# Patient Record
Sex: Male | Born: 1960 | Race: White | Hispanic: No
Health system: Southern US, Community
[De-identification: ages and names within clinical notes are randomized; demographics above are authoritative.]

---

## 2004-06-16 ENCOUNTER — Encounter: Admission: RE | Admit: 2004-06-16 | Discharge: 2004-06-16 | Payer: Self-pay | Admitting: Family Medicine

## 2004-06-23 ENCOUNTER — Ambulatory Visit: Payer: Self-pay | Admitting: Internal Medicine

## 2004-08-04 ENCOUNTER — Ambulatory Visit: Payer: Self-pay | Admitting: Internal Medicine

## 2010-08-22 ENCOUNTER — Other Ambulatory Visit: Payer: Self-pay | Admitting: Emergency Medicine

## 2010-08-22 DIAGNOSIS — N4400 Torsion of testis, unspecified: Secondary | ICD-10-CM

## 2010-08-23 ENCOUNTER — Ambulatory Visit
Admission: RE | Admit: 2010-08-23 | Discharge: 2010-08-23 | Disposition: A | Discharge status: Home / Self Care | Payer: BC Managed Care – PPO | Source: Ambulatory Visit | Attending: Emergency Medicine | Admitting: Emergency Medicine

## 2010-08-23 DIAGNOSIS — N4400 Torsion of testis, unspecified: Secondary | ICD-10-CM

## 2012-01-24 ENCOUNTER — Ambulatory Visit
Admission: RE | Admit: 2012-01-24 | Discharge: 2012-01-24 | Disposition: A | Discharge status: Home / Self Care | Payer: BC Managed Care – PPO | Source: Ambulatory Visit | Attending: Emergency Medicine | Admitting: Emergency Medicine

## 2012-01-24 ENCOUNTER — Other Ambulatory Visit: Payer: Self-pay | Admitting: Emergency Medicine

## 2012-01-24 DIAGNOSIS — R05 Cough: Secondary | ICD-10-CM

## 2012-01-26 ENCOUNTER — Other Ambulatory Visit (HOSPITAL_COMMUNITY): Payer: Self-pay | Admitting: Emergency Medicine

## 2012-01-26 DIAGNOSIS — D869 Sarcoidosis, unspecified: Secondary | ICD-10-CM

## 2012-02-01 ENCOUNTER — Ambulatory Visit (HOSPITAL_COMMUNITY)
Admission: RE | Admit: 2012-02-01 | Discharge: 2012-02-01 | Disposition: A | Discharge status: Home / Self Care | Payer: BC Managed Care – PPO | Source: Ambulatory Visit | Attending: Emergency Medicine | Admitting: Emergency Medicine

## 2012-02-01 DIAGNOSIS — D869 Sarcoidosis, unspecified: Secondary | ICD-10-CM | POA: Insufficient documentation

## 2012-02-01 MED ORDER — ALBUTEROL SULFATE (5 MG/ML) 0.5% IN NEBU
2.5000 mg | INHALATION_SOLUTION | Freq: Once | RESPIRATORY_TRACT | Status: AC
  Administered 2012-02-01: 2.5 mg via RESPIRATORY_TRACT

## 2012-12-12 ENCOUNTER — Encounter: Payer: Self-pay | Admitting: Emergency Medicine

## 2021-02-07 ENCOUNTER — Other Ambulatory Visit: Payer: Self-pay | Admitting: Family Medicine

## 2021-02-07 DIAGNOSIS — N390 Urinary tract infection, site not specified: Secondary | ICD-10-CM

## 2021-02-07 DIAGNOSIS — R972 Elevated prostate specific antigen [PSA]: Secondary | ICD-10-CM

## 2021-02-07 DIAGNOSIS — R350 Frequency of micturition: Secondary | ICD-10-CM

## 2021-03-22 ENCOUNTER — Other Ambulatory Visit: Payer: Self-pay

## 2021-03-31 ENCOUNTER — Other Ambulatory Visit: Payer: Self-pay

## 2021-05-16 ENCOUNTER — Other Ambulatory Visit: Payer: 59

## 2021-07-20 ENCOUNTER — Ambulatory Visit
Admission: RE | Admit: 2021-07-20 | Discharge: 2021-07-20 | Disposition: A | Discharge status: Home / Self Care | Payer: 59 | Source: Ambulatory Visit | Attending: Family Medicine | Admitting: Family Medicine

## 2021-07-20 ENCOUNTER — Other Ambulatory Visit: Payer: Self-pay

## 2021-07-20 DIAGNOSIS — R350 Frequency of micturition: Secondary | ICD-10-CM

## 2021-07-20 DIAGNOSIS — R972 Elevated prostate specific antigen [PSA]: Secondary | ICD-10-CM

## 2021-07-20 DIAGNOSIS — N390 Urinary tract infection, site not specified: Secondary | ICD-10-CM

## 2021-07-20 MED ORDER — GADOBENATE DIMEGLUMINE 529 MG/ML IV SOLN
16.0000 mL | Freq: Once | INTRAVENOUS | Status: AC | PRN
  Administered 2021-07-20: 16 mL via INTRAVENOUS

## 2022-07-28 ENCOUNTER — Ambulatory Visit
Admission: RE | Admit: 2022-07-28 | Discharge: 2022-07-28 | Disposition: A | Discharge status: Home / Self Care | Payer: 59 | Source: Ambulatory Visit | Attending: Family Medicine | Admitting: Family Medicine

## 2022-07-28 ENCOUNTER — Other Ambulatory Visit: Payer: Self-pay | Admitting: Family Medicine

## 2022-07-28 DIAGNOSIS — R079 Chest pain, unspecified: Secondary | ICD-10-CM

## 2022-07-28 NOTE — Progress Notes (Signed)
Several month h/o progressive chest discomfort. Non-pleuritic. Associated w/ intermittent fatigue.

## 2022-11-09 ENCOUNTER — Ambulatory Visit
Admission: RE | Admit: 2022-11-09 | Discharge: 2022-11-09 | Disposition: A | Discharge status: Home / Self Care | Payer: 59 | Source: Ambulatory Visit | Attending: Internal Medicine | Admitting: Internal Medicine

## 2022-11-09 ENCOUNTER — Other Ambulatory Visit: Payer: Self-pay | Admitting: Internal Medicine

## 2022-11-09 DIAGNOSIS — I959 Hypotension, unspecified: Secondary | ICD-10-CM

## 2023-09-21 ENCOUNTER — Other Ambulatory Visit: Payer: Self-pay | Admitting: Family Medicine

## 2023-09-21 DIAGNOSIS — N210 Calculus in bladder: Secondary | ICD-10-CM

## 2023-09-21 DIAGNOSIS — N400 Enlarged prostate without lower urinary tract symptoms: Secondary | ICD-10-CM

## 2023-09-21 DIAGNOSIS — N320 Bladder-neck obstruction: Secondary | ICD-10-CM

## 2023-09-21 NOTE — Progress Notes (Signed)
 Progressive sx of bladder outlet obstruction and hematuria.  Repeat MRI ordered.

## 2023-10-03 ENCOUNTER — Inpatient Hospital Stay: Admission: RE | Admit: 2023-10-03 | Source: Ambulatory Visit

## 2023-10-26 ENCOUNTER — Ambulatory Visit
Admission: RE | Admit: 2023-10-26 | Discharge: 2023-10-26 | Disposition: A | Discharge status: Home / Self Care | Source: Ambulatory Visit | Attending: Family Medicine | Admitting: Family Medicine

## 2023-10-26 DIAGNOSIS — N400 Enlarged prostate without lower urinary tract symptoms: Secondary | ICD-10-CM

## 2023-10-26 DIAGNOSIS — N210 Calculus in bladder: Secondary | ICD-10-CM

## 2023-10-26 DIAGNOSIS — N320 Bladder-neck obstruction: Secondary | ICD-10-CM

## 2023-10-26 MED ORDER — GADOPICLENOL 0.5 MMOL/ML IV SOLN
10.0000 mL | Freq: Once | INTRAVENOUS | Status: AC | PRN
  Administered 2023-10-26: 8 mL via INTRAVENOUS

## 2023-12-23 IMAGING — MR MR PROSTATE WO/W CM
12 series · 48 of 48 positions shown · IV contrast (multihance)
Comparison: None.

CLINICAL DATA: Elevated PSA.

EXAM:
MR PROSTATE WITHOUT AND WITH CONTRAST
TECHNIQUE: Multiplanar multisequence MRI images were obtained of the pelvis
centered about the prostate. Pre and post contrast images were
obtained.
CONTRAST:  16mL MULTIHANCE GADOBENATE DIMEGLUMINE 529 MG/ML IV SOLN

[Series 3: T2 · coronal · 3.0mm · 0.56mm/px · 1 of 26 slices shown (1 of 3)]
[im 1/26]
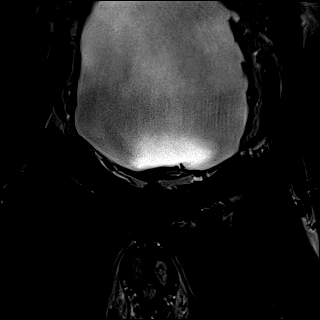

[Series 4: T1 · axial · 5.0mm · 1.25mm/px · 1 of 80 slices shown]
[im 1/80]
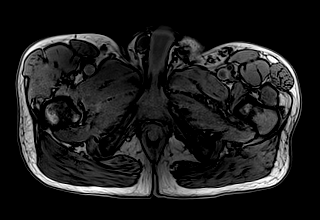

[Series 5: DWI · axial · 3.0mm · 1.75mm/px · z∈[-63,+3]mm · 2 of 69 slices shown (1 of 3)]
[im 1/69]
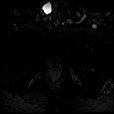
[im 69/69]
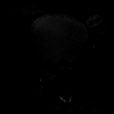

[Series 6: DWI · axial · 3.0mm · 1.75mm/px · 1 of 23 slices shown (2 of 3)]
[im 1/23]
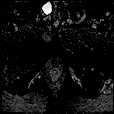

[Series 7: DWI · axial · 3.0mm · 1.75mm/px · 1 of 23 slices shown (3 of 3)]
[im 1/23]
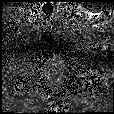

[Series 8: T2 · axial · 3.0mm · 0.56mm/px · 1 of 23 slices shown (2 of 3)]
[im 1/23]
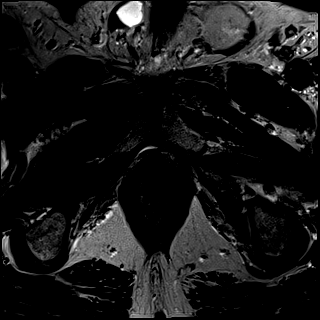

[Series 9: T2 · axial · 1.0mm · 1.04mm/px · z∈[-66,+5]mm · 2 of 72 slices shown (3 of 3)]
[im 1/72]
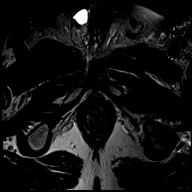
[im 72/72]
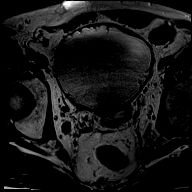

[Series 10: pre t1_twist_tra_dyn · axial · non-contrast · 3.5mm · 0.83mm/px · 1 of 20 slices shown]
[im 1/20]
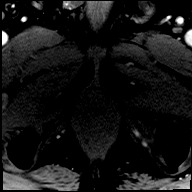

[Series 11: post t1_twist_tra_dyn-copy center · axial · non-contrast · 3.5mm · 0.83mm/px · z∈[-64,+3]mm · 17 of 600 slices shown]
[im 1/600]
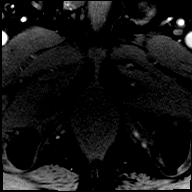
[im 38/600]
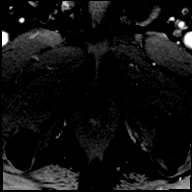
[im 75/600]
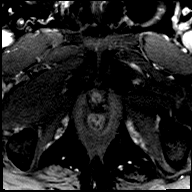
[im 113/600]
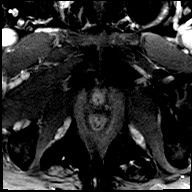
[im 150/600]
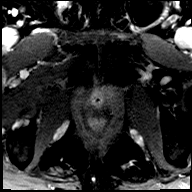
[im 188/600]
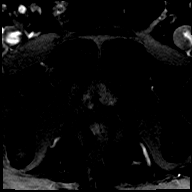
[im 225/600]
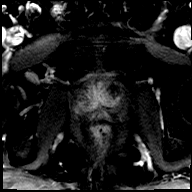
[im 263/600]
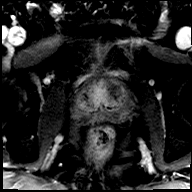
[im 300/600]
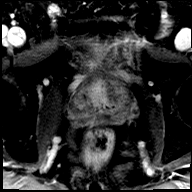
[im 337/600]
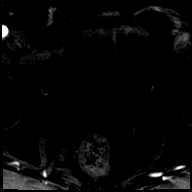
[im 375/600]
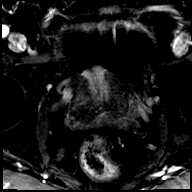
[im 412/600]
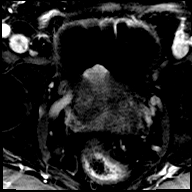
[im 450/600]
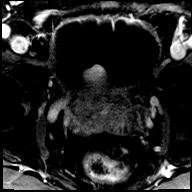
[im 487/600]
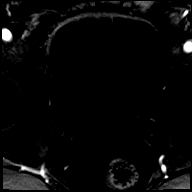
[im 525/600]
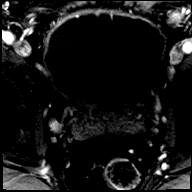
[im 562/600]
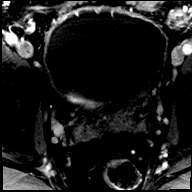
[im 600/600]
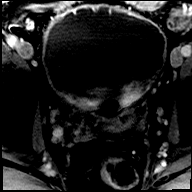

[Series 12: post t1_twist_tra_dyn-copy cent_sub · axial · 3.5mm · 0.83mm/px · z∈[-64,+3]mm · 17 of 580 slices shown]
[im 1/580]
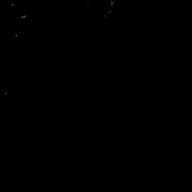
[im 37/580]
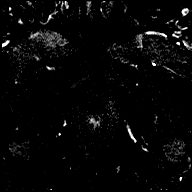
[im 73/580]
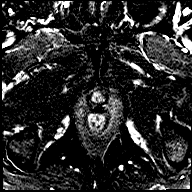
[im 109/580]
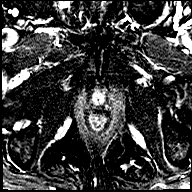
[im 145/580]
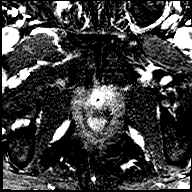
[im 181/580]
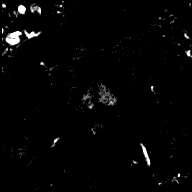
[im 218/580]
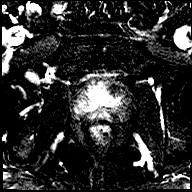
[im 254/580]
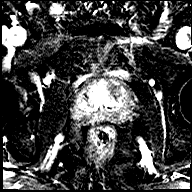
[im 290/580]
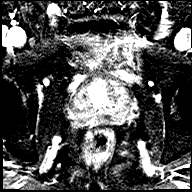
[im 326/580]
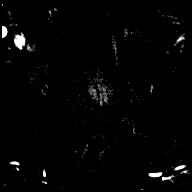
[im 362/580]
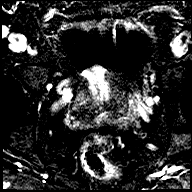
[im 399/580]
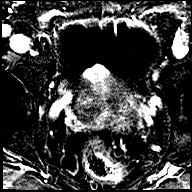
[im 435/580]
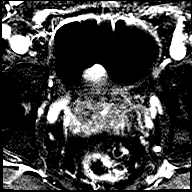
[im 471/580]
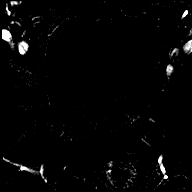
[im 507/580]
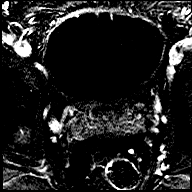
[im 543/580]
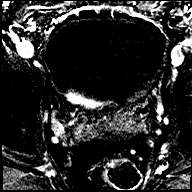
[im 580/580]
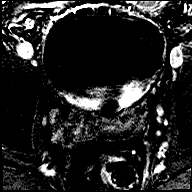

[Series 13: t1_vibe_dixon_tra_f · axial · 2.5mm · 0.91mm/px · z∈[-75,+122]mm · 2 of 80 slices shown]
[im 1/80]
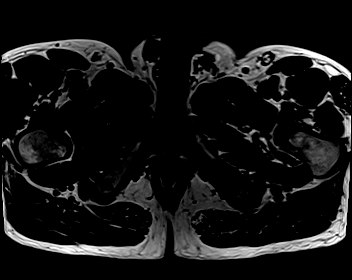
[im 80/80]
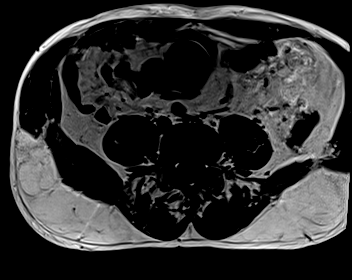

[Series 14: t1_vibe_dixon_tra_w · axial · 2.5mm · 0.91mm/px · z∈[-75,+122]mm · 2 of 80 slices shown]
[im 1/80]
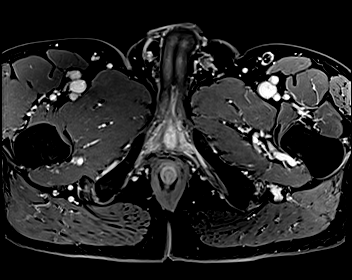
[im 80/80]
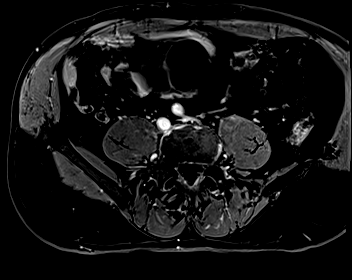

[48 of 48 positions shown; findings below may reference images not displayed]

FINDINGS: Prostate:

-- Peripheral Zone: Diffuse heterogeneous T2 hypointensity is seen.
Linear/wedge shaped hypointensities are noted on ADC; however, no
focal ADC hypointense or high b-value DWI hyperintense nodules are
identified.

-- Transition/Central Zone: Mildly enlarged with encapsulated BPH
nodules and median lobe hypertrophy, which indents the bladder base.
No suspicious nodules identified on T2-weighted or diffusion
sequences.

-- Measurements/Volume:  4.5 by 4.5 x 5.8 cm (volume = 61 cm^3)

Transcapsular spread:  Absent

Seminal vesicle involvement:  Absent

Neurovascular bundle involvement:  Absent

Pelvic adenopathy: None visualized

Bone metastasis: None visualized

Other: Distended urinary bladder is seen with diffuse wall
thickening and trabeculation, as well as several small bladder
diverticula. A 1.9 cm calculus is also seen in the urinary bladder.
These findings are consistent with chronic bladder outlet
obstruction.

Colonic diverticulosis is noted in the left lower quadrant, however
there is no evidence of diverticulitis. A small left inguinal hernia
is also seen, which contains only fat.
IMPRESSION: No radiographic evidence of high-grade prostate carcinoma. PI-RADS 2
(v.2.1): Low (clinically significant cancer unlikely)

Findings of chronic bladder outlet obstruction, with 1.9 cm bladder
calculus.

## 2024-02-22 NOTE — H&P (Addendum)
 [facility] Medicine  History and Physical    Assessment and Plan <redacted file path>   Mitchell Haley is a 63 y.o. male with history of BPH, urinary retention, HTN, and asthma who is presenting to [facility] with Sepsis secondary to UTI    .  Sepsis secondary to suspected UTI  AKI  BPH  Urinary Retention: Patient presented to urgent care with 2 day history of dysuria and gross hematuria that started after urology office visit on 8/12. He was noted to be febrile, tachycardic, and hypotensive to 80/60s in UC, so was sent to ED. Here, vitals notable for fever, tachycardia, and soft BP. Labs notable for mild leukocytosis WBC 12.2, mildly elevated venous lactate 1.9, and Cr 1.73 (baseline ~1.07). UA with large LE, large blood, >182 WBC, and >182 RBC. Patient reports previous history of multiple UTIs, though culture results not available. Last UTI >12 months ago. He has never had gross hematuria. Of note, had traumatic foley placement attempt in urology office two days ago. He is s/p Vancomycin, Cefepime, and LR 30 ml/kg in ED. Will continue broad spectrum antibiotics and follow-up cultures. Suspect can narrow to Ceftriaxone soon, however, given sepsis on presentation, known obstruction and urinary retention, and recent prostate exam and attempted foley placement, will continue broad abx overnight. Renal US  ordered to evaluate for hydronephrosis given known hx of urinary retention and AKI on presentation.  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  - Continue home Valtrex 1g daily ppx   Prophylaxis - Mechanical DVT ppx overnight iso gross hematuria   Diet -No diet orders on file  Code Status / HCDM <redacted file path> -Full Code, Discussed with patient at the time of admission  -  HCDM (patient stated preference): Mitchell Haley,Mitchell Haley - Daughter - [phone]  Anticipated Medically Ready for Discharge: <redacted file path> Anticipated in 2-4 Days  Significant Comorbid Conditions <redacted file path>:  Issues Impacting Complexity of Management: <redacted file path> -Intensive monitoring of drug toxicity from Vancomycin with scheduled BMP and/or Vancomycin levels and Cefepime with scheduled BMP  HPI    Mitchell Haley is a 63 y.o. male who is presenting to [facility] with Sepsis secondary to UTI    .  Patient was recently seen in outpatient urology clinic on 8/12 to establish care and be evaluated for LUTS and BPH. He reported having gross hematuria, nocturia, urinary urgency, urge incontinence, and urinary frequency which he attributed to bladder stones. He had a MRI prostate on 10/2023 which showed BPH, prostatomegaly, and three bladder calculi. His PVR in clinic was 814 mL. He was recommended to get indwelling foley catheter vs do intermittent catheterization for his retention. He chose to do intermittent catheterization, but was unable to perform it in clinic.   Since that visit, he has had significant increase in urinary frequency, urgency, and gross hematuria. He developed fevers and chills at home today which prompted him  to go to urgent care. No chest pain, shortness of breath, abdominal pain, flank pain. Has hx of multiple UTIs in the past with last one being >12 months ago. Has never had gross hematuria before. Of note, in-office catheterization attempt was traumatic.   ED Course: Patient presented with c/f  UTI. Vital Signs: T 38.3, HR 127, RR 20, BP 103/64, SpO2 97% on RA. Labs: WBC 12.2, Cr 1.73, lactate 1.9 Cultures: Blood x2, Urine  EKG: Sinus tachycardia. No ST elevations or depression Interventions: Vancomycin, Cefepime, LR 30 ml/kg, Acetaminophen   Med Rec Confidence <redacted file path>   I reviewed the Medication List. The current list is Accurate  Physical Exam   Temp:  [36.6 C (97.9 F)-38.7 C (101.6 F)] 36.6 C (97.9 F) Pulse:  [84-132] 84 SpO2 Pulse:  [85-87] 85 Resp:  [14-20] 15 BP: (81-138)/(48-84) 118/84 SpO2:  [94 %-98 %] 97 % Body mass index is 24.75 kg/m.  BP 118/84   Pulse 84   Temp 36.6 C (97.9 F) (Oral)   Resp 15   Ht 182.9 cm (6')   Wt 82.8 kg (182 lb 8 oz)   SpO2 97%   BMI 24.75 kg/m  General appearance: alert Head: Normocephalic, without obvious abnormality Eyes: conjunctivae/corneas clear. Neck: no adenopathy and supple, symmetrical, trachea midline Back: negative Lungs: clear to auscultation bilaterally Heart: regular rate and rhythm, S1, S2 normal, no murmur, click, rub or gallop Abdomen: soft, non-tender; bowel sounds normal; no masses,  no organomegaly Extremities: extremities normal, atraumatic, no cyanosis or edema Pulses: 2+ and symmetric. Skin: Skin color, texture, turgor normal. No rashes or lesions

## 2024-02-25 NOTE — Discharge Summary (Signed)
 Physician Discharge Summary [facility] [address] [place] [address] Dept: [phone] Loc: [phone]   Identifying Information:  Mitchell Haley 10-Dec-1960 [id number]  Primary Care Physician: Remonia Alm Pac, MD  Code Status: Full Code  Admit Date: 02/22/2024  Discharge Date: 02/25/2024   Discharge To: Home  Discharge Service: [facility] - Hospitalist [place]   Discharge Attending Physician: Guido Bland Dennise Idella, MD  Discharge Diagnoses: Principal Problem:   Sepsis secondary to UTI     (POA: Yes) Active Problems:   Urinary retention (POA: Yes)   Asthma (HHS-HCC) (POA: Yes)   Benign prostatic hyperplasia (POA: Yes)   Essential hypertension (POA: Yes)   Hypertriglyceridemia (POA: Yes)   Shingles (POA: Yes) Resolved Problems:   * No resolved hospital problems. *   Outpatient Provider Follow Up Issues:  [ ]  TOV set up with urology [ ]  see if flomax with finasteride works for his symptomatic prostate  [ ]  possibly recheck urine if still having any problems, or symptoms  Hospital Course:  Mitchell Haley is a 63 year old male with a history of benign prostatic hyperplasia (BPH), urinary retention, hypertension, asthma, hypertriglyceridemia, and prior shingles, who was admitted on 02/22/2024 for sepsis secondary to urinary tract infection (UTI) following recent urologic instrumentation.  PROBLEM-ORIENTED HOSPITAL SUMMARY  Sepsis Secondary to UTI / Acute Kidney Injury (AKI) He presented with two days of dysuria, gross hematuria, fever, and chills after a traumatic in-office catheterization attempt for urinary retention and BPH. On arrival, he was febrile, tachycardic, and hypotensive. Initial labs showed leukocytosis (WBC 12.2), elevated creatinine (1.73, baseline ~1.07), and urinalysis with large leukocyte esterase, large blood, and >182 WBC and RBC. He received vancomycin, cefepime, and IV fluids in the ED. Blood and urine cultures  were obtained and remained negative at 48 hours. Renal ultrasound showed no hydronephrosis or stones, with a collapsed bladder due to the indwelling Foley. His creatinine improved to 0.90 by 8/18. 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 Reviewing literature saw that some abx that treat this bug do not penetrate the prostate well. Due to ths discussed about this prostate exam done just the week prior to ask about discomfort or pain he has associated with his prostate. He denied. Thus, chose to finish treatment with doxycycline for 14 days and not treat as prostatitis but more as a complex UTI.  He clinically improved with resolution of fever and normalization of labs prior to discharge.  Urinary Retention / Benign Prostatic Hyperplasia He has a history of BPH with significant post-void residual (814 mL) and multiple prior UTIs. He was unable to perform intermittent catheterization at home and had a traumatic catheterization attempt in clinic. During hospitalization, a Foley catheter was maintained for urinary drainage, with improvement in urine clarity and output. Urology was consulted for follow-up, and a trial of void and possible clean intermittent catheterization teaching was planned for 7-12 days post-discharge. He was instructed on leg bag use prior to discharge. I also  asked as he has been on flomax with not full  resolution of symptoms, thus started finasterfide on discharge with hopes he can get the foley out and dec sx.   Essential Hypertension Home amlodipine was held during the acute phase due to sepsis and hypotension. Blood pressure stabilized during hospitalization, but amlodipine remained on hold as of 8/17 due  to systolic BP of 125. Restarted last day.   Asthma Home montelukast was continued throughout the  hospitalization without reported exacerbation.  Hypertriglyceridemia Home fenofibrate was continued during the admission.  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  Other He was independent with activities of daily living prior to admission and declined home health services at discharge. He lives with his adult daughter, who will assist with care at home.   Procedures: None No admission procedures for hospital encounter. ______________________________________________________________________ Discharge Medications:   Your Medication List     START taking these medications    acetaminophen 500 MG tablet Commonly known as: TYLENOL Take 2 tablets (1,000 mg total) by mouth every eight (8) hours as needed.   doxycycline 100 MG capsule Commonly known as: VIBRAMYCIN Take 1 capsule (100 mg total) by mouth two (2) times a day.   finasteride 5 mg tablet Commonly known as: PROSCAR Take 1 tablet (5 mg total) by mouth daily.       CONTINUE taking these medications    amlodipine 5 MG tablet Commonly known as: NORVASC Take 1 tablet (5 mg total) by mouth nightly.   fenofibrate 160 MG tablet Commonly known as: LOFIBRA Take 1 tablet (160 mg total) by mouth in the morning.   FLOMAX 0.4 mg capsule Generic drug: tamsulosin Take 1 capsule (0.4 mg total) by mouth in the morning.   montelukast 10 mg tablet Commonly known as: SINGULAIR Take 1 tablet (10 mg total) by mouth in the morning.   multivitamin per tablet Commonly known as: TAB-A-VITE/THERAGRAN Take 1 tablet by mouth in the morning.   valACYclovir 500 MG tablet Commonly known as: VALTREX Take 2 tablets (1,000 mg total) by mouth daily.   VITAMIN D3 ORAL Take 1 tablet by mouth daily.        Allergies: Aspirin, Penicillins, Aspirin, and Penicillin  g ______________________________________________________________________ Pending Test Results (if blank, then none): Pending Labs     Order Current Status   Blood Culture Preliminary result   Blood Culture Preliminary result       Most Recent Labs: Microbiology -  Microbiology Results (last day)     Procedure Component Value Date/Time Date/Time   Blood Culture [[id number]]  (Normal) Collected: 02/22/24 2151   Lab Status: Preliminary result Specimen: Blood from 1 Peripheral Draw Updated: 02/24/24 2215    Blood Culture, Routine No Growth at 48 hours   Blood Culture [[id number]]  (Normal) Collected: 02/22/24 2151   Lab Status: Preliminary result Specimen: Blood from 1 Peripheral Draw Updated: 02/24/24 2215    Blood Culture, Routine No Growth at 48 hours      CBC - Results in Past 30 Days Result Component Current Result Ref Range Previous Result Ref Range  HCT 39.5 (02/25/2024) 39.0 - 48.0 % 40.4 (02/23/2024) 39.0 - 48.0 %  HGB 13.6 (02/25/2024) 12.9 - 16.5 g/dL 86.3 (1/83/7974) 87.0 - 16.5 g/dL  MCH 69.9 (1/81/7974) 74.0 - 32.4 pg 30.2 (02/23/2024) 25.9 - 32.4 pg  MCHC 34.5 (02/25/2024) 32.0 - 36.0 g/dL 66.1 (1/83/7974) 67.9 - 36.0 g/dL  MCV 13.0 (1/81/7974) 22.3 - 95.7 fL 89.5 (02/23/2024) 77.6 - 95.7 fL  MPV 8.9 (02/25/2024) 6.8 - 10.7 fL 8.5 (02/22/2024) 6.8 - 10.7 fL  Platelet 180 (02/25/2024) 150 - 450 10*9/L >182 (02/23/2024) 150 - 450 10*9/L  RBC 4.55 (02/25/2024) 4.26 - 5.60 10*12/L 4.51 (02/23/2024) 4.26 - 5.60 10*12/L  WBC 7.3 (  02/25/2024) 3.6 - 11.2 10*9/L 10.2 (02/23/2024) 3.6 - 11.2 10*9/L   BMP - Results in Past 30 Days Result Component Current Result Ref Range Previous Result Ref Range  BUN 7 (L) (02/25/2024) 9 - 23 mg/dL 11 (1/83/7974) 9 - 23 mg/dL  Chloride 897 (1/81/7974) 98 - 107 mmol/L 104 (02/23/2024) 98 - 107 mmol/L  CO2 25.0 (02/25/2024) 20.0 - 31.0 mmol/L 26.0 (02/23/2024) 20.0 - 31.0 mmol/L  Creatinine 0.90 (02/25/2024) 0.73 - 1.18 mg/dL 8.74 (H) (1/83/7974) 9.26 - 1.18  mg/dL  Glucose 879 (1/81/7974) 70 - 179 mg/dL 879 (1/83/7974) 70 - 820 mg/dL  Potassium 3.4 (1/81/7974) 3.4 - 4.8 mmol/L 4.0 (02/23/2024) 3.4 - 4.8 mmol/L  Sodium 139 (02/25/2024) 135 - 145 mmol/L 142 (02/23/2024) 135 - 145 mmol/L    Relevant Studies/Radiology (if blank, then none): ECG 12 Lead Result Date: 02/23/2024 SINUS TACHYCARDIA NON-DIAGNOSTIC INFERIOR Q WAVES POOR R WAVE PROGRESSION IN PRECORDIAL LEADS WHEN COMPARED WITH ECG OF 09-Aug-2022 13:03, CRITERIA FOR ANTERIOR INFARCT  ARE NO LONGER PRESENT CHEST LEAD PLACEMENT CHANGE IS SUSPECTED Confirmed by Vinie Dunnings ([id number]) on 02/23/2024 9:12:23 AM  US  Renal Complete Result Date: 02/23/2024 EXAM: US  RENAL COMPLETE ACCESSION: [id number] UN REPORT DATE: 02/22/2024 11:18 PM CLINICAL INDICATION: 63 years old with dysuria, hematuria eval hydronephrosis  COMPARISON: None. TECHNIQUE: Static and cine images of the kidneys and bladder were performed. FINDINGS: KIDNEYS: Normal size and echogenicity. No solid masses or calculi. No hydronephrosis.      Right kidney: 11.0 cm      Left kidney: 11.5 cm BLADDER: The urinary bladder is collapsed with a Foley catheter in place, limiting evaluation.   No evidence of hydronephrosis.   ______________________________________________________________________ Discharge Instructions:  Activity Instructions     Activity as tolerated         Diet Instructions     Discharge diet (specify)     Discharge Nutrition Therapy: Regular       Other Instructions     Call MD for:     Blood in foley, pain with no output from foley to bag. Any concerns for infection or other concerning symptoms.   Call MD for:  difficulty breathing, headache or visual disturbances     Call MD for:  persistent nausea or vomiting     Call MD for:  severe uncontrolled pain     Call MD for: Temperature > 38.5 Celsius ( > 101.3 Fahrenheit)     Discharge instructions     You came in with urinary obstruction with bad UTI and had sepsis  but was able to be rapidly treated with IVF, IV abx and close monitoring.   Your urine grew out an uncommon bacteria but more often associated with prostate problems.    >100,000 CFU/mL Actinotignum (Actinobaculum) schaalii  You will be treated with doxycycline to complete your treatment of this infection. If you have fevers or other concerns pls reach out for medical assistance.   You will have follow up in Urology clinic for trial of void to get the foley out. Continue your flomax and we will start finasteride to see if this helps your urinary symptoms. But it can dec libido and low some testosterone levels, just so you know.  Pls continue to foley care taught at the hospital. Thank you.       Follow Up instructions and Outpatient Referrals    Call MD for:     Call MD for:  difficulty breathing, headache or visual disturbances     Call MD for:  persistent nausea or vomiting     Call MD for:  severe uncontrolled pain     Call MD for: Temperature > 38.5 Celsius ( > 101.3 Fahrenheit)     Discharge instructions       Appointments which have been scheduled for you    Mar 04, 2024 2:00 PM (Arrive by 1:30 PM) NURSE VISIT URO with SURURO NURSE [facility] UROLOGY [address] [employer] [place] [facility] REGION) [address] [place] [address] [phone]     Mar 12, 2024 10:15 AM (Arrive by 9:45 AM) CYSTOSCOPY with [facility] PROCEDURES [facility] UROLOGY PROCEDURES [place] [facility] REGION) [address] [place] [address] [phone]     Apr 01, 2024 9:15 AM (Arrive by 9:00 AM) RETURN UROLOGY with Nicholaus [place] V[place]as[place], MD [facility] UROLOGY SERVICE [place] [facility] REGION) [address] [place] [address] [place] [address] [phone]        ______________________________________________________________________ Discharge Day Services: BP 106/81   Pulse 95   Temp 36.8 C (98.2 F) (Oral)   Resp 18   Ht  182.9 cm (6')   Wt 80.9 kg (178 lb 4.8 oz)   SpO2 97%   BMI 24.18 kg/m  Pt seen on the day of discharge and determined appropriate for discharge.  Condition at Discharge: good  Length of Discharge: I spent greater than 30 mins in the discharge of this patient.

## 2024-04-01 NOTE — Progress Notes (Signed)
 ANALYSIS AND PLAN:  This is a 63 y.o. male with a history of asthma and high blood pressure who is referred by his primary care physician for the assessment of bothersome lower urinary tract symptoms suggestive of symptomatic BPH. I reviewed the patients kidney stone history and postvoid residual in clinic today. His PVR is 806 ml and IPSS 10(1). I reviewed the patients recent local cystoscopy findings and prior prostate size on MRI imaging with multiple bladder stones. We reviewed his response to Flomax and Finasteride medications in which he may continue. The risk and side effects of the medications were discussed including the FDA blackbox warning for finasteride as a prostate cancer prevention drug and the expected artificial halving of the PSA value on therapy. The patient will obtain outside PSA results for my review. I remain concerned with the patient's significant postvoid residual though I think it is favorable that the patient's ultrasound does not currently show any upper tract dilation. He has not been able to perform intermittent catheterization and is not interested in long term catheterization. Discussed newer gold standard treatment option holmium laser enucleation of the prostate with Cystolitholapaxy for bladder stone removal. Specifically for HoLEP surgery, the risk and benefits of the procedure including but not limited to bleeding with possible need for blood transfusion, infection, injury to the ureter, bladder, urethra, injury to the urinary sphincter resulting in incontinence, need for multiple surgeries, erectile or ejaculatory problems specifically likelihood of retrograde ejaculation, need for prolonged foley placement, incidental diagnosis of prostate cancer and the risks of general anesthesia. We discussed the irritative urinary symptoms and risk of morcellator injury to the bladder which are specific to the laser enucleation surgery. All questions were answered.  The patient will  contact our clinic for further treatment. We would be happy to see the patient back anytime sooner if symptoms arise.   Remonia Alm Pac, MD [address] [place],  [place] [address]  CHIEF COMPLAINT: History of BPH and bothersome lower urinary tract symptoms here for follow-up.   BRIEF HISTORY OF PRESENT ILLNESS:  Mitchell Haley is very pleasant 63 y.o. male who returns for the evaluation of bothersome lower urinary tract symptoms and BPH. The patient notes urinary complaints for a prolonged amount of time. He is currently maintained on medication therapy including tamsulosin. He notes being on Flomax for the last 3 years. He reports resolution of urinary symptoms while on medication. He notes a history of bladder stones and urinary tract/bladder infections. Patient reports an elevated PSA of 4.5 about 3 years ago. He notes this was during a time of a urinary infection. A prostate MRI was done which identified multiple bladder stones. His primary urinary complaints include gross hematuria, nocturia 1-2x, urinary urgency, urge incontinence and urinary frequency which he attributes to the bladder stones. At his last clinic visit, he was recommended to perform intermittent catheterization but was unable to perform the technique. He subsequently required indwelling catheter placement for a period of time. Since he was last seen, he reports he is voiding freely with no issues. Patient was started on Finasteride following recent hospital visit, he reports no issues. He denies a history of kidney stones. He notes his PSA is monitored yearly which have been low and stable. The patient denies a history of dysuria. He has never had a prostate biopsy in the past. He notes his father experienced prostate issues and his paternal uncle diagnosed with prostate cancer following years of prostate issues. He does  not have a history of sleep apnea or significant snoring. The patient has no history of fevers,  chills, nausea, vomiting, chest pain or shortness of breath.   OSH GU history:   PAST MEDICAL HISTORY:    Past Medical History[1]   PAST SURGICAL HISTORY:  Past Surgical History[2]  ALLERGIES:   is allergic to aspirin, penicillins, aspirin, and penicillin g.  MEDICATIONS: Current Medications[3]   SOCIAL HISTORY: Short Social History[4]   FAMILY HISTORY:  Positive for prostate cancer disease. Family History[5]   REVIEW OF SYSTEMS:  A 10-system review of systems was completed by the patient and reviewed by me today.  All pertinent positives were discussed in history of present illness.  PHYSICAL EXAMINATION: GENERAL:  In no apparent distress. HEENT:  Eyes, ears and mouth appeared normal. LYMPHATICS:  No cervical lymphadenopathy. LUNGS:  Chest wall excursion symmetric bilaterally. No audible wheezing ABDOMEN:  Soft, nontender, nondistended. BACK:  Negative CVA tenderness.    DRE: Prostate moderately enlarged, smooth, soft no nodules EXTREMITIES:  No lower extremity edema. SKIN:  No rashes or jaundice. NEUROLOGIC:  No focal deficits on neurologic exam.  LABORATORY TESTING:      Chemistry      Component Value Date/Time   NA 139 02/25/2024 0901   K 3.4 02/25/2024 0901   CL 102 02/25/2024 0901   CO2 25.0 02/25/2024 0901   BUN 7 (L) 02/25/2024 0901   CREATININE 0.90 02/25/2024 0901   GLU 120 02/25/2024 0901      Component Value Date/Time   CALCIUM 8.9 02/25/2024 0901   ALKPHOS 49 02/22/2024 2033   AST 20 02/22/2024 2033   ALT 25 02/22/2024 2033   BILITOT 1.2 02/22/2024 2033      10/29/2023 OSH MRI prostate report    02/22/2024 RUS Impression  No evidence of hydronephrosis.   03/12/2024 Local cystoscopy Flexible cystoscopy was performed.  The urethral meatus and urethra were normal.  The prostate was trilobar with severe coaptation. There was a impressive median intravesical protrusion. The bladder had no tumors but did have multiple spciulated bladder  stoens. There was severe trabeculatio with small scattered diverticuli.  02/19/2024 AUA symptom score total of 24 with bother of 5 and leakage of 1 02/19/2024  postvoid residual >864 ml , 814 ml on second void   04/01/2024 AUA SS total of 10 with bother of 1 04/03/2024 postvoid residual 806 ml   Scribe's Attestation: Nicholaus Porto, MD obtained and performed the history, physical exam and medical decision making elements that were entered into the chart.  Signed by Jordyn Threadgill, Scribe, on April 01, 2024 at 9:45 AM.  ---------------------------------------------------------------------------------------------------------------------- April 07, 2024 4:51 PM. Documentation assistance provided by the Scribe. I was present during the time the encounter was recorded. The information recorded by the Scribe was done at my direction and has been reviewed and validated by me.  Nicholaus Porto MD ----------------------------------------------------------------------------------------------------------------------                 [1] Past Medical History: Diagnosis Date  . Asthma ([facility]HS-HCC)   . HBP (high blood pressure)   . Shingles 2024  [2] Past Surgical History: Procedure Laterality Date  . PR COLSC FLX W/RMVL OF TUMOR POLYP LESION SNARE TQ Left 07/27/2023   Procedure: COLONOSCOPY FLEX; W/REMOV TUMOR/LES BY SNARE;  Surgeon: Angelena Luke Grey, MD;  Location: [facility];  Service: Gastroenterology  [3] Current Outpatient Medications  Medication Sig Dispense Refill  . acetaminophen (TYLENOL) 500 MG tablet Take 2 tablets (1,000  mg total) by mouth every eight (8) hours as needed. 30 tablet 0  . amlodipine (NORVASC) 5 MG tablet Take 1 tablet (5 mg total) by mouth nightly. (Patient not taking: Reported on 04/01/2024)    . cholecalciferol, vitamin D3, (VITAMIN D3 ORAL) Take 1 tablet by mouth daily.    SABRA doxycycline (VIBRAMYCIN) 100 MG capsule Take 1 capsule  (100 mg total) by mouth two (2) times a day. 28 capsule 0  . fenofibrate (LOFIBRA) 160 MG tablet Take 1 tablet (160 mg total) by mouth in the morning.    . finasteride (PROSCAR) 5 mg tablet Take 1 tablet (5 mg total) by mouth daily. 30 tablet 2  . montelukast (SINGULAIR) 10 mg tablet Take 1 tablet (10 mg total) by mouth in the morning.    . multivitamin (TAB-A-VITE/THERAGRAN) per tablet Take 1 tablet by mouth in the morning.    . tamsulosin (FLOMAX) 0.4 mg capsule Take 1 capsule (0.4 mg total) by mouth in the morning.    . valACYclovir (VALTREX) 500 MG tablet Take 2 tablets (1,000 mg total) by mouth daily. 60 tablet 11   No current facility-administered medications for this visit.  [4] Social History Tobacco Use  . Smoking status: Never    Passive exposure: Never  . Smokeless tobacco: Never  Vaping Use  . Vaping status: Never Used  Substance Use Topics  . Alcohol use: Not Currently  . Drug use: Never  [5] Family History Problem Relation Age of Onset  . Heart failure Paternal Grandmother   . Angina Paternal Grandfather   . Heart failure Paternal Grandfather   . Amblyopia Neg Hx   . Blindness Neg Hx   . Macular degeneration Neg Hx   . Retinal detachment Neg Hx   . Strabismus Neg Hx
